# Patient Record
Sex: Male | Born: 2000 | Race: Black or African American | Hispanic: No | Marital: Single | Smoking: Never smoker
Health system: Southern US, Community
[De-identification: ages and names within clinical notes are randomized; demographics above are authoritative.]

## PROBLEM LIST (undated history)

## (undated) DIAGNOSIS — J45909 Unspecified asthma, uncomplicated: Secondary | ICD-10-CM

## (undated) HISTORY — PX: TONSILLECTOMY: SUR1361

## (undated) HISTORY — DX: Unspecified asthma, uncomplicated: J45.909

## (undated) HISTORY — PX: ADENOIDECTOMY: SUR15

---
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
## 2024-10-14 ENCOUNTER — Emergency Department (HOSPITAL_COMMUNITY)
Admission: EM | Admit: 2024-10-14 | Discharge: 2024-10-14 | Disposition: A | Discharge status: Home / Self Care | Attending: Emergency Medicine | Admitting: Emergency Medicine

## 2024-10-14 ENCOUNTER — Other Ambulatory Visit: Payer: Self-pay

## 2024-10-14 DIAGNOSIS — J45909 Unspecified asthma, uncomplicated: Secondary | ICD-10-CM | POA: Diagnosis not present

## 2024-10-14 DIAGNOSIS — M533 Sacrococcygeal disorders, not elsewhere classified: Secondary | ICD-10-CM | POA: Diagnosis not present

## 2024-10-14 DIAGNOSIS — Z7951 Long term (current) use of inhaled steroids: Secondary | ICD-10-CM | POA: Insufficient documentation

## 2024-10-14 DIAGNOSIS — R350 Frequency of micturition: Secondary | ICD-10-CM | POA: Insufficient documentation

## 2024-10-14 DIAGNOSIS — R300 Dysuria: Secondary | ICD-10-CM | POA: Diagnosis present

## 2024-10-14 LAB — URINALYSIS, ROUTINE W REFLEX MICROSCOPIC
Bilirubin Urine: NEGATIVE
Glucose, UA: NEGATIVE mg/dL
Hgb urine dipstick: NEGATIVE
Ketones, ur: NEGATIVE mg/dL
Leukocytes,Ua: NEGATIVE
Nitrite: NEGATIVE
Protein, ur: NEGATIVE mg/dL
Specific Gravity, Urine: 1.005 (ref 1.005–1.030)
pH: 7 (ref 5.0–8.0)

## 2024-10-14 LAB — CBG MONITORING, ED: Glucose-Capillary: 116 mg/dL — ABNORMAL HIGH (ref 70–99)

## 2024-10-14 NOTE — ED Triage Notes (Signed)
 Pt reports x 1 week he has had dysuria, increased freq, burning and left flank pain. Pt reports recently tested for STIs results -.

## 2024-10-14 NOTE — ED Provider Notes (Signed)
 " [name] [facility] AT [facility] Provider Note   CSN: [id number] Arrival date & time: 10/14/24  1048     History  Chief Complaint  Patient presents with   Dysuria    Ryan Shelton is a 23 y.o. male with PMH as listed below who presents with 1 week he has had dysuria, increased freq, burning and left lower lumbar/sacral back pain that started within the last couple of days but has none now. Pt reports recently tested for STIs results, per chart review 10/11/24 test on care everywhere, chlamydia/gonorrhea neg, mycoplasma neg, and UA was negative as well at [facility]. No recent antibiotics. Had a positive chlamydia test one month ago, was treated until 09/04/24, then had negative test one week ago. Denies hematuria. Reports typically poor water intake, drinks a lot of soda/beer. No testicular discomfort or pain. No swelling or trauma to the area.   Past Medical History:  Diagnosis Date   Asthma        Home Medications Prior to Admission medications  Medication Sig Start Date End Date Taking? Authorizing Provider  albuterol (PROVENTIL) (2.5 MG/3ML) 0.083% nebulizer solution Take 2.5 mg by nebulization every 6 (six) hours as needed for wheezing or shortness of breath.    [provider]  budesonide (PULMICORT) 0.25 MG/2ML nebulizer solution Take 0.25 mg by nebulization 2 (two) times daily.    [provider]  ibuprofen  (ADVIL ) 600 MG tablet Take 1 tablet (600 mg total) by mouth every 6 (six) hours as needed (pain). 09/01/19   Deis, Jamie, MD  loratadine (CLARITIN) 5 MG chewable tablet Chew 5 mg by mouth daily.    [provider]  montelukast (SINGULAIR) 10 MG tablet Take 10 mg by mouth at bedtime.    [provider]      Allergies    Patient has no known allergies.    Review of Systems   Review of Systems A 10 point review of systems was performed and is negative unless otherwise reported in HPI.  Physical  Exam Updated Vital Signs BP (!) 141/75   Pulse 70   Temp 98.5 F (36.9 C) (Oral)   Resp 17   Ht 5' 10 (1.778 m)   Wt 70.3 kg   SpO2 100%   BMI 22.24 kg/m  Physical Exam General: Normal appearing male, lying in bed.  HEENT: PERRLA, Sclera anicteric, MMM, trachea midline.  Cardiology: RRR Resp: Normal respiratory rate and effort.  Abd: Soft, non-tender, non-distended. No rebound tenderness or guarding.  GU: Deferred. MSK: No peripheral edema or signs of trauma. Skin: warm, dry. Back: No CVA tenderness Neuro: A&Ox4, CNs II-XII grossly intact. MAEs. Sensation grossly intact. Normal gait. Normal speech. Psych: Normal mood and affect.   ED Results / Procedures / Treatments   Labs (all labs ordered are listed, but only abnormal results are displayed) Labs Reviewed  URINALYSIS, ROUTINE W REFLEX MICROSCOPIC - Abnormal; Notable for the following components:      Result Value   Color, Urine STRAW (*)    All other components within normal limits  CBC WITH DIFFERENTIAL/PLATELET  BASIC METABOLIC PANEL WITH GFR    EKG None  Radiology No results found.  Procedures Procedures    Medications Ordered in ED Medications - No data to display  ED Course/ Medical Decision Making/ A&P                          Medical Decision Making  Amount and/or Complexity of Data Reviewed Labs: ordered. Decision-making details documented in ED Course.    This patient presents to the ED for concern of dysuria, this involves an extensive number of treatment options, and is a complaint that carries with it a high risk of complications and morbidity.  I considered the following differential and admission for this acute, potentially life threatening condition. Well-appearing, HDS, afebrile.  MDM:    Considered UTI, prostatitis, urethritis. UA neg for any infection or leukocytes. Had negative chlamydia/gonorrhea/mycoplasma a few days ago for same sxs and reports no interim unprotected sexual  encounter. No abd pain or flank pain/CVA TTP to indicate ureterolithiasis. No hematuria. Recommended labs but patient refused. Consider interstitial cystitis, dehydration (though none on UA), chemical irritation, or urethral stricture. He is able to urinate well. Advised to f/u with urology within 1-2 weeks for further evaluation.   Clinical Course as of 10/16/24 1051  Thu Oct 14, 2024  1329 Urinalysis, Routine w reflex microscopic -Urine, Clean Catch(!) No UTI [HN]  1359 I recommended labs including CBC/BMP to check for leukocytosis, renal function, and electrolytes, but patient refused labwork. No glucosuria but POC glucose would be helpful. [HN]    Clinical Course User Index [HN] Franklyn Sid SAILOR, MD    Labs: I Ordered, and personally interpreted labs.  The pertinent results include:  those listed above  Additional history obtained from chart review, mother at bedside.    Reevaluation: After the interventions noted above, I reevaluated the patient and found that they have :improved  Social Determinants of Health: Lives independently  Disposition:  DC w/ discharge instructions/return precautions. All questions answered to patient's satisfaction.    Co morbidities that complicate the patient evaluation  Past Medical History:  Diagnosis Date   Asthma      Medicines No orders of the defined types were placed in this encounter.   I have reviewed the patients home medicines and have made adjustments as needed  Problem List / ED Course: Problem List Items Addressed This Visit   None Visit Diagnoses       Dysuria    -  Primary                   This note was created using dictation software, which may contain spelling or grammatical errors.    Franklyn Sid SAILOR, MD 10/16/24 1054  "

## 2024-10-14 NOTE — Discharge Instructions (Addendum)
 Thank you for coming to [facility] Emergency Department. You were seen for burning with urination. We did an exam, labs and these showed no acute findings. You already had negative gonorrhea, chlamydia, and mycoplasma tests recently. Please follow up with a urologist Dr. Shane within 1 week. You can take tylenol, ibuprofen  for pain or discomfort as well as over the counter Azo for urinary symptoms. Please continue to stay well-hydrated.   Please follow up with a urologist within 1 week.   Do not hesitate to return to the ED or call 911 if you experience: -Worsening symptoms -Nausea/vomiting so severe that you cannot eat/drink or take your medications -Severe flank pain -Lightheadedness, passing out -Fevers/chills -Anything else that concerns you

## 2024-10-15 LAB — GC/CHLAMYDIA PROBE AMP (~~LOC~~) NOT AT ARMC
Chlamydia: NEGATIVE
Comment: NEGATIVE
Comment: NORMAL
Neisseria Gonorrhea: NEGATIVE
